# Patient Record
Sex: Male | Born: 1975 | Race: White | Hispanic: No | Marital: Married | Smoking: Current every day smoker
Health system: Southern US, Community
[De-identification: ages and names within clinical notes are randomized; demographics above are authoritative.]

## PROBLEM LIST (undated history)

## (undated) DIAGNOSIS — R7303 Prediabetes: Secondary | ICD-10-CM

## (undated) DIAGNOSIS — K219 Gastro-esophageal reflux disease without esophagitis: Secondary | ICD-10-CM

## (undated) DIAGNOSIS — J329 Chronic sinusitis, unspecified: Secondary | ICD-10-CM

## (undated) DIAGNOSIS — E785 Hyperlipidemia, unspecified: Secondary | ICD-10-CM

## (undated) DIAGNOSIS — Z6839 Body mass index (BMI) 39.0-39.9, adult: Secondary | ICD-10-CM

## (undated) DIAGNOSIS — I10 Essential (primary) hypertension: Secondary | ICD-10-CM

## (undated) DIAGNOSIS — K5790 Diverticulosis of intestine, part unspecified, without perforation or abscess without bleeding: Secondary | ICD-10-CM

## (undated) DIAGNOSIS — Z860100 Personal history of colon polyps, unspecified: Secondary | ICD-10-CM

## (undated) DIAGNOSIS — E669 Obesity, unspecified: Secondary | ICD-10-CM

## (undated) DIAGNOSIS — R0789 Other chest pain: Secondary | ICD-10-CM

## (undated) DIAGNOSIS — M5030 Other cervical disc degeneration, unspecified cervical region: Secondary | ICD-10-CM

## (undated) DIAGNOSIS — R109 Unspecified abdominal pain: Secondary | ICD-10-CM

## (undated) DIAGNOSIS — M519 Unspecified thoracic, thoracolumbar and lumbosacral intervertebral disc disorder: Secondary | ICD-10-CM

## (undated) DIAGNOSIS — R3129 Other microscopic hematuria: Secondary | ICD-10-CM

## (undated) HISTORY — DX: Diverticulosis of intestine, part unspecified, without perforation or abscess without bleeding: K57.90

## (undated) HISTORY — DX: Other microscopic hematuria: R31.29

## (undated) HISTORY — PX: WISDOM TOOTH EXTRACTION: SHX21

## (undated) HISTORY — DX: Essential (primary) hypertension: I10

## (undated) HISTORY — DX: Obesity, unspecified: E66.9

## (undated) HISTORY — DX: Body mass index (BMI) 39.0-39.9, adult: Z68.39

## (undated) HISTORY — DX: Chronic sinusitis, unspecified: J32.9

## (undated) HISTORY — DX: Other chest pain: R07.89

## (undated) HISTORY — DX: Gastro-esophageal reflux disease without esophagitis: K21.9

## (undated) HISTORY — DX: Hyperlipidemia, unspecified: E78.5

## (undated) HISTORY — DX: Unspecified abdominal pain: R10.9

## (undated) HISTORY — PX: COLONOSCOPY: SHX174

## (undated) HISTORY — DX: Unspecified thoracic, thoracolumbar and lumbosacral intervertebral disc disorder: M51.9

## (undated) HISTORY — DX: Prediabetes: R73.03

## (undated) HISTORY — DX: Personal history of colon polyps, unspecified: Z86.0100

## (undated) HISTORY — DX: Other cervical disc degeneration, unspecified cervical region: M50.30

---

## 1999-12-03 ENCOUNTER — Encounter: Payer: Self-pay | Admitting: Emergency Medicine

## 1999-12-03 ENCOUNTER — Emergency Department (HOSPITAL_COMMUNITY): Admission: EM | Admit: 1999-12-03 | Discharge: 1999-12-03 | Payer: Self-pay | Admitting: Emergency Medicine

## 1999-12-06 ENCOUNTER — Emergency Department (HOSPITAL_COMMUNITY): Admission: EM | Admit: 1999-12-06 | Discharge: 1999-12-06 | Payer: Self-pay | Admitting: Emergency Medicine

## 1999-12-13 ENCOUNTER — Emergency Department (HOSPITAL_COMMUNITY): Admission: EM | Admit: 1999-12-13 | Discharge: 1999-12-13 | Payer: Self-pay | Admitting: Emergency Medicine

## 1999-12-16 ENCOUNTER — Emergency Department (HOSPITAL_COMMUNITY): Admission: EM | Admit: 1999-12-16 | Discharge: 1999-12-16 | Payer: Self-pay | Admitting: Emergency Medicine

## 2001-09-13 ENCOUNTER — Emergency Department (HOSPITAL_COMMUNITY): Admission: EM | Admit: 2001-09-13 | Discharge: 2001-09-13 | Payer: Self-pay | Admitting: *Deleted

## 2001-09-13 ENCOUNTER — Encounter: Payer: Self-pay | Admitting: *Deleted

## 2004-06-10 ENCOUNTER — Emergency Department (HOSPITAL_COMMUNITY): Admission: EM | Admit: 2004-06-10 | Discharge: 2004-06-10 | Payer: Self-pay | Admitting: Emergency Medicine

## 2005-07-21 ENCOUNTER — Encounter: Admission: RE | Admit: 2005-07-21 | Discharge: 2005-07-21 | Payer: Self-pay | Admitting: Internal Medicine

## 2007-07-02 ENCOUNTER — Encounter: Admission: RE | Admit: 2007-07-02 | Discharge: 2007-07-02 | Payer: Self-pay | Admitting: Internal Medicine

## 2007-08-20 ENCOUNTER — Encounter: Admission: RE | Admit: 2007-08-20 | Discharge: 2007-08-20 | Payer: Self-pay | Admitting: Internal Medicine

## 2007-09-05 ENCOUNTER — Observation Stay (HOSPITAL_COMMUNITY): Admission: EM | Admit: 2007-09-05 | Discharge: 2007-09-06 | Payer: Self-pay | Admitting: Emergency Medicine

## 2007-09-10 ENCOUNTER — Encounter: Admission: RE | Admit: 2007-09-10 | Discharge: 2007-09-10 | Payer: Self-pay | Admitting: Internal Medicine

## 2010-08-24 NOTE — H&P (Signed)
NAMETAVEON, Russell Alvarez               ACCOUNT NO.:  [id number]   MEDICAL RECORD NO.:  [id number]          PATIENT TYPE:  EMS   LOCATION:  ED                           FACILITY[facility]  [facility]   PHYSICIAN:  Michiel Cowboy, MDDATE OF BIRTH:  Jul 09, 1975   DATE OF ADMISSION:  09/04/2007  DATE OF DISCHARGE:                              HISTORY & PHYSICAL   PRIMARY CARE Russell Alvarez:  Russell Housekeeper, MD   CHIEF COMPLAINT:  Abdominal pain.   This is a 35 year old male with past medical history of hypertension who  presents with a 3-day history of abdominal pain that started on Sunday,  at first was very severe.  He endorsed fevers and chills, temperature up  to 102 at home.  He did not want to come to emergency department because  he was out of town.  Eventually when he finally did present to emergency  department, his pain at this point is subsiding and he is feeling  better.  Has been afebrile, no nausea or vomiting.  No constipation or  diarrhea.   REVIEW OF SYSTEMS:  Unremarkable except for as per HPI.   PAST MEDICAL HISTORY:  Hypertension.   SOCIAL HISTORY:  The patient smokes but does not drink or use drugs.  Works in a plumbing business.   FAMILY HISTORY:  Noncontributory.   ALLERGIES:  No known drug allergies.   MEDICATIONS:  Diovan, dose unknown.   PHYSICAL EXAM:  VITAL SIGNS:  Temperature 98.7, blood pressure 120/88,  pulse 89, respirations 20, saturating 98% on room air.  The patient appears to be in no acute distress, fairly comfortable,  sitting up on the stretcher, not toxic.  LUNGS:  Clear to auscultation bilaterally.  HEART:  Regular rate and rhythm.  No murmurs, rubs or gallops.  HEAD:  Nontraumatic.  LOWER EXTREMITIES:  Without edema.  ABDOMEN:  Soft.  No rebound or guarding.  There is a localized left  lower quadrant pain and tenderness.  NEUROLOGIC:  Intact.   LABS:  White blood cell count 12.9, hemoglobin 14.5, platelets 184.  Sodium 139, potassium 3.5, creatinine  0.92.  UA shows blood but no white  blood cells, small red blood cells.  CT of the abdomen and pelvis  showing symmetrically concentric proximal to mid sigmoid colon wall  thickening with pericolonic soft tissue stranding with adjacent  extraluminal collection of gas and fluid measuring 2.7 x 1.9  cm in  maximum dimension.  Small number of proximal sigmoid colon diverticula.  Unremarkable urinary bladder and prostate gland.   ASSESSMENT AND PLAN:  This is a 35 year old gentleman with what appears  to be perforated diverticulitis.   1. Perforated diverticulitis.  Here in the emergency department this      case was discussed with surgery, who felt that there was no      operative intervention at this point.  Actually felt that the      patient could go home but given that he is still in some pain and      has an elevated white blood cell count, we will admit and put on IV  antibiotics, Zosyn, then the patient can be switched back to p.o.      Flagyl and Cipro.  We will give clear liquids, laxative of choice,      IV hydration.  Would recommend official surgical consult in a.m.      given the size of the abscess.  Would probably need to repeat CT      scan in about a week or so to demonstrate improvement.  2. Hypertension.  Unsure about the dose, but we will start on Diovan      80 mg until the patient's family can bring medications from home.  3. Prophylaxis.  Protonix and SCDs.   Dr. Donette Larry or his partners to assume care in the morning.      Michiel Cowboy, MD  Electronically Signed     AVD/MEDQ  D:  09/05/2007  T:  09/05/2007  Job:  [id number]   cc:   Russell Housekeeper, MD  Fax: [phone]

## 2010-08-24 NOTE — Discharge Summary (Signed)
Russell Alvarez, Russell Alvarez               ACCOUNT NO.:  [id number]   MEDICAL RECORD NO.:  [id number]          PATIENT TYPE:  INP   LOCATION:  1423                         FACILITY:  [facility]   PHYSICIAN:  [name], MD      DATE OF BIRTH:  [birth year]   DATE OF ADMISSION:  09/05/2007  DATE OF DISCHARGE:  09/06/2007                               DISCHARGE SUMMARY   DISCHARGE DIAGNOSES:  1. Acute diverticulitis with microperforation of the sigmoid, resolved      without surgery.  2. Hypokalemia, resolved.  3. Hypertension.   DISCHARGE MEDICATIONS:  1. Diovan HCT 80/12.5 mg daily.  2. Cipro 500 mg b.i.d. for 2 weeks.  3. Flagyl 500 mg b.i.d. for 2 weeks.  4. Percocet 5/325 one tablet q.6 p.r.n. for pain.  5. Colace stool softener b.i.d. p.r.n.   DIET:  Bland.   FOLLOW-UP:  On September 10, 2007, with follow-up CT scan of the abdomen and  pelvis.   CONSULTATION:  Surgery.   LABORATORY DATA:  White count 10.3, hemoglobin 13.7, potassium 3.7,  creatinine 0.9.  LFTs normal.  Blood cultures negative.  Urine culture  negative.  TSH 2.9.  A1c 5.6.  CT of the abdomen and pelvis; acute  sigmoid diverticulitis with microperforation contained.  The UA was  negative.   HOSPITAL COURSE:  A 35 year old male with a history of hypertension came  in with acute abdominal pain.  A CT scan of the abdomen and pelvis  showed acute diverticulitis with microperforation which was contained.  Surgery was consulted.  They considered IV antibiotics and IV fluids.  He did not require any surgical intervention.  The patient  hemodynamically remained stable.  His initial white count was elevated  at 12.9, came down to 10.3.  The patient was started on Zosyn IV and IV  fluids, which was later switched to Cipro and Flagyl p.o. for 2 weeks.  He remained afebrile.  The pain improved much significant.  The patient  will continue on a bland diet and will have a follow-up CT in about 3-5  days and follow as an outpatient.   Hypertension, will continue on Diovan  HCT.  Had did have mild hypokalemia, which resolved.      [name], MD  Electronically Signed     KH/MEDQ  D:  09/06/2007  T:  09/06/2007  Job:  [id number]

## 2011-01-05 LAB — URINE CULTURE
Colony Count: NO GROWTH
Culture: NO GROWTH

## 2011-01-05 LAB — BASIC METABOLIC PANEL
Chloride: 105
GFR calc Af Amer: >60
GFR calc non Af Amer: >60
GFR calc non Af Amer: >60
Potassium: 3.5
Potassium: 3.7
Sodium: 139

## 2011-01-05 LAB — COMPREHENSIVE METABOLIC PANEL
ALT: 20
Alkaline Phosphatase: 81
BUN: 11
CO2: 26
Chloride: 105
GFR calc non Af Amer: >60
Glucose, Bld: 101 — ABNORMAL HIGH
Potassium: 3.3 — ABNORMAL LOW
Total Bilirubin: 1.2
Total Protein: 6.9

## 2011-01-05 LAB — URINE MICROSCOPIC-ADD ON

## 2011-01-05 LAB — CBC
HCT: 38.9 — ABNORMAL LOW
HCT: 41.5
Hemoglobin: 13.7
Hemoglobin: 14.5
MCV: 88.6
Platelets: 175
RBC: 4.68
WBC: 10.3
WBC: 12.9 — ABNORMAL HIGH

## 2011-01-05 LAB — PROTIME-INR
INR: 1
Prothrombin Time: 13.7

## 2011-01-05 LAB — URINALYSIS, ROUTINE W REFLEX MICROSCOPIC
Glucose, UA: NEGATIVE
Ketones, ur: NEGATIVE
Leukocytes, UA: NEGATIVE
pH: 5.5

## 2011-01-05 LAB — DIFFERENTIAL
Eosinophils Relative: 1
Lymphocytes Relative: 17
Lymphs Abs: 2.2
Monocytes Absolute: 0.9

## 2011-01-05 LAB — LIPID PANEL
HDL: 27 — ABNORMAL LOW
LDL Cholesterol: 85
Total CHOL/HDL Ratio: 4.6
Triglycerides: 61
VLDL: 12

## 2011-01-05 LAB — CULTURE, BLOOD (ROUTINE X 2): Culture: NO GROWTH

## 2012-09-13 ENCOUNTER — Other Ambulatory Visit: Payer: Self-pay | Admitting: Internal Medicine

## 2012-09-13 ENCOUNTER — Ambulatory Visit
Admission: RE | Admit: 2012-09-13 | Discharge: 2012-09-13 | Disposition: A | Discharge status: Home / Self Care | Payer: BC Managed Care – PPO | Source: Ambulatory Visit | Attending: Internal Medicine | Admitting: Internal Medicine

## 2012-09-13 DIAGNOSIS — R109 Unspecified abdominal pain: Secondary | ICD-10-CM

## 2013-08-15 ENCOUNTER — Other Ambulatory Visit: Payer: Self-pay | Admitting: Internal Medicine

## 2013-08-15 DIAGNOSIS — R911 Solitary pulmonary nodule: Secondary | ICD-10-CM

## 2013-11-15 ENCOUNTER — Ambulatory Visit
Admission: RE | Admit: 2013-11-15 | Discharge: 2013-11-15 | Disposition: A | Discharge status: Home / Self Care | Payer: No Typology Code available for payment source | Source: Ambulatory Visit | Attending: Internal Medicine | Admitting: Internal Medicine

## 2013-11-15 ENCOUNTER — Encounter (INDEPENDENT_AMBULATORY_CARE_PROVIDER_SITE_OTHER): Payer: Self-pay

## 2013-11-15 DIAGNOSIS — R911 Solitary pulmonary nodule: Secondary | ICD-10-CM

## 2013-11-15 MED ORDER — IOHEXOL 300 MG/ML  SOLN
75.0000 mL | Freq: Once | INTRAMUSCULAR | Status: AC | PRN
  Administered 2013-11-15: 75 mL via INTRAVENOUS

## 2014-01-07 ENCOUNTER — Encounter: Payer: Self-pay | Admitting: *Deleted

## 2014-07-08 ENCOUNTER — Ambulatory Visit (INDEPENDENT_AMBULATORY_CARE_PROVIDER_SITE_OTHER): Payer: No Typology Code available for payment source | Admitting: Interventional Cardiology

## 2014-07-08 ENCOUNTER — Encounter: Payer: Self-pay | Admitting: Interventional Cardiology

## 2014-07-08 VITALS — BP 122/80 | HR 62 | Ht 73.2 in | Wt 291.0 lb

## 2014-07-08 DIAGNOSIS — I10 Essential (primary) hypertension: Secondary | ICD-10-CM | POA: Diagnosis not present

## 2014-07-08 DIAGNOSIS — I493 Ventricular premature depolarization: Secondary | ICD-10-CM | POA: Diagnosis not present

## 2014-07-08 DIAGNOSIS — E663 Overweight: Secondary | ICD-10-CM

## 2014-07-08 NOTE — Progress Notes (Signed)
Patient ID: Russell Alvarez, male   DOB: December 12, 1975, 39 y.o.   MRN: [id number]     Cardiology Office Note   Date:  07/08/2014   ID:  Russell Alvarez, DOB [birth year], MRN [id number]  PCP:  [name], MD  Cardiologist:   Corky Crafts., MD   No chief complaint on file. PVCs    History of Present Illness: Russell Alvarez is a 39 y.o. male who has had PVCs and atypical chest pain in the past.  I saw him in 2012.  He has had HTN as well.  He had a rash, eczema, which was treated with clobetasol.  Once this was started, he had frequent PVC s and he felt tired.  He stopped the cream and the PVCs decreased significantly.    He does minimize his caffeine intake.  He avoids coffee and soda but drinks tea.  He walks a lot.  He quit all tobacco use.  He has had trouble losing weight.  He follows with Dr. Donette Larry every 6 months.     Past Medical History  Diagnosis Date  . Hypertension   . Dyslipidemia   . GERD (gastroesophageal reflux disease)   . Diverticulosis   . Obesity   . DDD (degenerative disc disease), cervical     Past Surgical History  Procedure Laterality Date  . Colonoscopy    . Wisdom tooth extraction       Current Outpatient Prescriptions  Medication Sig Dispense Refill  . clobetasol cream (TEMOVATE) 0.05 % Apply 1 application topically 2 (two) times daily.    . fluticasone (FLONASE) 50 MCG/ACT nasal spray   1  . Omega-3 Fatty Acids (FISH OIL) 1000 MG CAPS Take by mouth.    . pantoprazole (PROTONIX) 40 MG tablet Take 40 mg by mouth daily.    . valsartan-hydrochlorothiazide (DIOVAN-HCT) 80-12.5 MG per tablet Take 1 tablet by mouth daily.     No current facility-administered medications for this visit.    Allergies:   Review of patient's allergies indicates no known allergies.    Social History:  The patient  reports that he has been smoking.  He does not have any smokeless tobacco history on file. He reports that he drinks alcohol. He reports that he does  not use illicit drugs.   Family History:  The patient's family history includes Heart failure in his mother; Hypertension in his mother.    ROS:  Please see the history of present illness.   Otherwise, review of systems are positive for recent palpitations as noted above. These felt similar to the PVCs that he had in the past. No chest discomfort with exertion.   All other systems are reviewed and negative.    PHYSICAL EXAM: VS:  Pulse 62  Ht 6' 1.2" (1.859 m)  Wt 291 lb (131.997 kg)  BMI 38.19 kg/m2 , BMI Body mass index is 38.19 kg/(m^2). GEN: Well nourished, well developed, in no acute distress HEENT: normal Neck: no JVD, carotid bruits, or masses Cardiac: RRR; no murmurs, rubs, or gallops,no edema  Respiratory:  clear to auscultation bilaterally, normal work of breathing GI: soft, nontender, nondistended, + BS MS: no deformity or atrophy Skin: warm and dry, areas of excoriation noted on his elbows and his calves  Neuro:  Strength and sensation are intact Psych: euthymic mood, full affect   EKG:  EKG is ordered today. The ekg ordered today demnormal sinus rhythm, no significant ST segment changes, sinus arrhythmia   Recent Labs: No results found  for requested labs within last 365 days.    Lipid Panel    Component Value Date/Time   CHOL  09/05/2007 0520    124        ATP III CLASSIFICATION:  <200     mg/dL   Desirable  [id number]  mg/dL   Borderline High  >=469>=240    mg/dL   High   TRIG 61 62/95/284105/27/2009 0520   HDL 27* 09/05/2007 0520   CHOLHDL 4.6 09/05/2007 0520   VLDL 12 09/05/2007 0520   LDLCALC  09/05/2007 0520    85        Total Cholesterol/HDL:CHD Risk Coronary Heart Disease Risk Table                     Men   Women  1/2 Average Risk   3.4   3.3      Wt Readings from Last 3 Encounters:  07/08/14 291 lb (131.997 kg)      Other studies Reviewed: Additional studies/ records that were reviewed today include: 2007 echo. Review of the above records  demonstrates: normal echo 3/12 stress test: no ischemia  ASSESSMENT AND PLAN:  1.   PVCs: Better control since stopping the steroid cream. Continue to avoid caffeine. I asked him to decrease his tea intake both for minimizing caffeine as well as for weight loss.  Negative workup several years ago. Given that his symptoms are better now, we'll not plan on any further testing.  2.  Obesity: Continue regular exercise. Decrease sweet tea intake. There significant calories that he is likely taking him with a couple of glasses of sweet tea daily. This will help with weight loss. Continue efforts at healthy diet.  3.  Hypertension: Well controlled. Continue current medicines. Continue regular labs with Dr. Eula ListenHussain.   Current medicines are reviewed at length with the patient today.  The patient does not have concerns regarding medicines.  The following changes have been made:  no change  Labs/ tests ordered today include: none  No orders of the defined types were placed in this encounter.     Disposition:   FU with me prn   Delorise JacksonSigned, Doloris Servantes S., MD  07/08/2014 9:02 AM    [facility] [address], [place], [place]  [address] Phone: [phone]; Fax: [phone]

## 2014-07-08 NOTE — Patient Instructions (Signed)
**Note De-identified Russell Alvarez Obfuscation** Your physician recommends that you continue on your current medications as directed. Please refer to the Current Medication list given to you today.  Your physician recommends that you schedule a follow-up appointment in: as needed  

## 2016-05-05 DIAGNOSIS — J019 Acute sinusitis, unspecified: Secondary | ICD-10-CM | POA: Diagnosis not present

## 2016-05-10 DIAGNOSIS — J019 Acute sinusitis, unspecified: Secondary | ICD-10-CM | POA: Diagnosis not present

## 2016-06-22 DIAGNOSIS — J019 Acute sinusitis, unspecified: Secondary | ICD-10-CM | POA: Diagnosis not present

## 2016-06-22 DIAGNOSIS — J309 Allergic rhinitis, unspecified: Secondary | ICD-10-CM | POA: Diagnosis not present

## 2016-07-14 DIAGNOSIS — H6502 Acute serous otitis media, left ear: Secondary | ICD-10-CM | POA: Diagnosis not present

## 2016-07-28 DIAGNOSIS — J3089 Other allergic rhinitis: Secondary | ICD-10-CM | POA: Diagnosis not present

## 2016-07-28 DIAGNOSIS — H6983 Other specified disorders of Eustachian tube, bilateral: Secondary | ICD-10-CM | POA: Diagnosis not present

## 2016-08-19 DIAGNOSIS — H6982 Other specified disorders of Eustachian tube, left ear: Secondary | ICD-10-CM | POA: Diagnosis not present

## 2016-09-03 DIAGNOSIS — K5792 Diverticulitis of intestine, part unspecified, without perforation or abscess without bleeding: Secondary | ICD-10-CM | POA: Diagnosis not present

## 2016-09-20 DIAGNOSIS — H6983 Other specified disorders of Eustachian tube, bilateral: Secondary | ICD-10-CM | POA: Diagnosis not present

## 2016-10-04 DIAGNOSIS — Z1389 Encounter for screening for other disorder: Secondary | ICD-10-CM | POA: Diagnosis not present

## 2016-10-04 DIAGNOSIS — E782 Mixed hyperlipidemia: Secondary | ICD-10-CM | POA: Diagnosis not present

## 2016-10-04 DIAGNOSIS — Z0000 Encounter for general adult medical examination without abnormal findings: Secondary | ICD-10-CM | POA: Diagnosis not present

## 2016-10-04 DIAGNOSIS — I10 Essential (primary) hypertension: Secondary | ICD-10-CM | POA: Diagnosis not present

## 2016-11-11 DIAGNOSIS — R0789 Other chest pain: Secondary | ICD-10-CM | POA: Diagnosis not present

## 2016-11-11 DIAGNOSIS — K219 Gastro-esophageal reflux disease without esophagitis: Secondary | ICD-10-CM | POA: Diagnosis not present

## 2016-12-06 DIAGNOSIS — H9203 Otalgia, bilateral: Secondary | ICD-10-CM | POA: Diagnosis not present

## 2016-12-06 DIAGNOSIS — I10 Essential (primary) hypertension: Secondary | ICD-10-CM | POA: Diagnosis not present

## 2016-12-19 DIAGNOSIS — R51 Headache: Secondary | ICD-10-CM | POA: Diagnosis not present

## 2016-12-19 DIAGNOSIS — J320 Chronic maxillary sinusitis: Secondary | ICD-10-CM | POA: Diagnosis not present

## 2016-12-19 DIAGNOSIS — J322 Chronic ethmoidal sinusitis: Secondary | ICD-10-CM | POA: Diagnosis not present

## 2016-12-19 DIAGNOSIS — J341 Cyst and mucocele of nose and nasal sinus: Secondary | ICD-10-CM | POA: Diagnosis not present

## 2016-12-19 DIAGNOSIS — J329 Chronic sinusitis, unspecified: Secondary | ICD-10-CM | POA: Diagnosis not present

## 2016-12-27 ENCOUNTER — Ambulatory Visit
Admission: RE | Admit: 2016-12-27 | Discharge: 2016-12-27 | Disposition: A | Discharge status: Home / Self Care | Payer: BLUE CROSS/BLUE SHIELD | Source: Ambulatory Visit | Attending: Internal Medicine | Admitting: Internal Medicine

## 2016-12-27 ENCOUNTER — Other Ambulatory Visit: Payer: Self-pay | Admitting: Internal Medicine

## 2016-12-27 DIAGNOSIS — R519 Headache, unspecified: Secondary | ICD-10-CM

## 2016-12-27 DIAGNOSIS — R51 Headache: Principal | ICD-10-CM

## 2017-03-21 DIAGNOSIS — J309 Allergic rhinitis, unspecified: Secondary | ICD-10-CM | POA: Diagnosis not present

## 2017-03-21 DIAGNOSIS — M7660 Achilles tendinitis, unspecified leg: Secondary | ICD-10-CM | POA: Diagnosis not present

## 2017-03-21 DIAGNOSIS — I10 Essential (primary) hypertension: Secondary | ICD-10-CM | POA: Diagnosis not present

## 2017-04-13 DIAGNOSIS — J0140 Acute pansinusitis, unspecified: Secondary | ICD-10-CM | POA: Diagnosis not present

## 2017-04-27 DIAGNOSIS — M79672 Pain in left foot: Secondary | ICD-10-CM | POA: Diagnosis not present

## 2017-05-04 DIAGNOSIS — J069 Acute upper respiratory infection, unspecified: Secondary | ICD-10-CM | POA: Diagnosis not present

## 2017-05-31 DIAGNOSIS — R0781 Pleurodynia: Secondary | ICD-10-CM | POA: Diagnosis not present

## 2017-05-31 DIAGNOSIS — R1010 Upper abdominal pain, unspecified: Secondary | ICD-10-CM | POA: Diagnosis not present

## 2017-05-31 DIAGNOSIS — J329 Chronic sinusitis, unspecified: Secondary | ICD-10-CM | POA: Diagnosis not present

## 2017-07-04 DIAGNOSIS — R05 Cough: Secondary | ICD-10-CM | POA: Diagnosis not present

## 2017-10-05 DIAGNOSIS — E782 Mixed hyperlipidemia: Secondary | ICD-10-CM | POA: Diagnosis not present

## 2017-10-05 DIAGNOSIS — Z1389 Encounter for screening for other disorder: Secondary | ICD-10-CM | POA: Diagnosis not present

## 2017-10-05 DIAGNOSIS — Z0000 Encounter for general adult medical examination without abnormal findings: Secondary | ICD-10-CM | POA: Diagnosis not present

## 2017-11-15 DIAGNOSIS — J329 Chronic sinusitis, unspecified: Secondary | ICD-10-CM | POA: Diagnosis not present

## 2017-11-15 DIAGNOSIS — J3081 Allergic rhinitis due to animal (cat) (dog) hair and dander: Secondary | ICD-10-CM | POA: Diagnosis not present

## 2017-11-15 DIAGNOSIS — J3089 Other allergic rhinitis: Secondary | ICD-10-CM | POA: Diagnosis not present

## 2017-11-15 DIAGNOSIS — R21 Rash and other nonspecific skin eruption: Secondary | ICD-10-CM | POA: Diagnosis not present

## 2017-11-15 DIAGNOSIS — J301 Allergic rhinitis due to pollen: Secondary | ICD-10-CM | POA: Diagnosis not present

## 2018-03-12 DIAGNOSIS — Z86010 Personal history of colonic polyps: Secondary | ICD-10-CM | POA: Diagnosis not present

## 2018-03-12 DIAGNOSIS — K5730 Diverticulosis of large intestine without perforation or abscess without bleeding: Secondary | ICD-10-CM | POA: Diagnosis not present

## 2018-03-12 DIAGNOSIS — D123 Benign neoplasm of transverse colon: Secondary | ICD-10-CM | POA: Diagnosis not present

## 2018-03-14 DIAGNOSIS — D123 Benign neoplasm of transverse colon: Secondary | ICD-10-CM | POA: Diagnosis not present

## 2018-04-27 DIAGNOSIS — J3081 Allergic rhinitis due to animal (cat) (dog) hair and dander: Secondary | ICD-10-CM | POA: Diagnosis not present

## 2018-04-27 DIAGNOSIS — J329 Chronic sinusitis, unspecified: Secondary | ICD-10-CM | POA: Diagnosis not present

## 2018-04-27 DIAGNOSIS — J301 Allergic rhinitis due to pollen: Secondary | ICD-10-CM | POA: Diagnosis not present

## 2018-04-27 DIAGNOSIS — J3089 Other allergic rhinitis: Secondary | ICD-10-CM | POA: Diagnosis not present

## 2018-08-31 DIAGNOSIS — J309 Allergic rhinitis, unspecified: Secondary | ICD-10-CM | POA: Diagnosis not present

## 2018-08-31 DIAGNOSIS — I10 Essential (primary) hypertension: Secondary | ICD-10-CM | POA: Diagnosis not present

## 2018-08-31 DIAGNOSIS — R21 Rash and other nonspecific skin eruption: Secondary | ICD-10-CM | POA: Diagnosis not present

## 2018-09-18 DIAGNOSIS — Z0000 Encounter for general adult medical examination without abnormal findings: Secondary | ICD-10-CM | POA: Diagnosis not present

## 2018-09-18 DIAGNOSIS — I10 Essential (primary) hypertension: Secondary | ICD-10-CM | POA: Diagnosis not present

## 2018-09-18 DIAGNOSIS — Z125 Encounter for screening for malignant neoplasm of prostate: Secondary | ICD-10-CM | POA: Diagnosis not present

## 2018-09-18 DIAGNOSIS — Z1389 Encounter for screening for other disorder: Secondary | ICD-10-CM | POA: Diagnosis not present

## 2018-09-18 DIAGNOSIS — Z23 Encounter for immunization: Secondary | ICD-10-CM | POA: Diagnosis not present

## 2018-12-03 DIAGNOSIS — J3081 Allergic rhinitis due to animal (cat) (dog) hair and dander: Secondary | ICD-10-CM | POA: Diagnosis not present

## 2018-12-03 DIAGNOSIS — J329 Chronic sinusitis, unspecified: Secondary | ICD-10-CM | POA: Diagnosis not present

## 2018-12-03 DIAGNOSIS — J301 Allergic rhinitis due to pollen: Secondary | ICD-10-CM | POA: Diagnosis not present

## 2018-12-03 DIAGNOSIS — J3089 Other allergic rhinitis: Secondary | ICD-10-CM | POA: Diagnosis not present

## 2019-02-05 DIAGNOSIS — M542 Cervicalgia: Secondary | ICD-10-CM | POA: Diagnosis not present

## 2019-02-05 DIAGNOSIS — M519 Unspecified thoracic, thoracolumbar and lumbosacral intervertebral disc disorder: Secondary | ICD-10-CM | POA: Diagnosis not present

## 2019-09-25 DIAGNOSIS — E782 Mixed hyperlipidemia: Secondary | ICD-10-CM | POA: Diagnosis not present

## 2019-09-25 DIAGNOSIS — Z1389 Encounter for screening for other disorder: Secondary | ICD-10-CM | POA: Diagnosis not present

## 2019-09-25 DIAGNOSIS — Z0000 Encounter for general adult medical examination without abnormal findings: Secondary | ICD-10-CM | POA: Diagnosis not present

## 2019-09-25 DIAGNOSIS — K219 Gastro-esophageal reflux disease without esophagitis: Secondary | ICD-10-CM | POA: Diagnosis not present

## 2019-09-25 DIAGNOSIS — Z125 Encounter for screening for malignant neoplasm of prostate: Secondary | ICD-10-CM | POA: Diagnosis not present

## 2019-09-25 DIAGNOSIS — I10 Essential (primary) hypertension: Secondary | ICD-10-CM | POA: Diagnosis not present

## 2019-12-17 DIAGNOSIS — R234 Changes in skin texture: Secondary | ICD-10-CM | POA: Diagnosis not present

## 2019-12-17 DIAGNOSIS — R350 Frequency of micturition: Secondary | ICD-10-CM | POA: Diagnosis not present

## 2020-03-31 DIAGNOSIS — M79672 Pain in left foot: Secondary | ICD-10-CM | POA: Diagnosis not present

## 2020-04-06 DIAGNOSIS — J019 Acute sinusitis, unspecified: Secondary | ICD-10-CM | POA: Diagnosis not present

## 2020-04-06 DIAGNOSIS — U071 COVID-19: Secondary | ICD-10-CM | POA: Diagnosis not present

## 2020-04-06 DIAGNOSIS — J209 Acute bronchitis, unspecified: Secondary | ICD-10-CM | POA: Diagnosis not present

## 2020-04-11 DIAGNOSIS — R059 Cough, unspecified: Secondary | ICD-10-CM | POA: Diagnosis not present

## 2020-04-11 DIAGNOSIS — J1282 Pneumonia due to coronavirus disease 2019: Secondary | ICD-10-CM | POA: Diagnosis not present

## 2020-04-11 DIAGNOSIS — U071 COVID-19: Secondary | ICD-10-CM | POA: Diagnosis not present

## 2020-04-11 DIAGNOSIS — R0602 Shortness of breath: Secondary | ICD-10-CM | POA: Diagnosis not present

## 2020-04-11 DIAGNOSIS — R079 Chest pain, unspecified: Secondary | ICD-10-CM | POA: Diagnosis not present

## 2020-04-23 ENCOUNTER — Other Ambulatory Visit: Payer: Self-pay | Admitting: Internal Medicine

## 2020-04-23 ENCOUNTER — Ambulatory Visit
Admission: RE | Admit: 2020-04-23 | Discharge: 2020-04-23 | Disposition: A | Discharge status: Home / Self Care | Payer: BC Managed Care – PPO | Source: Ambulatory Visit | Attending: Internal Medicine | Admitting: Internal Medicine

## 2020-04-23 DIAGNOSIS — J189 Pneumonia, unspecified organism: Secondary | ICD-10-CM

## 2020-04-23 DIAGNOSIS — U071 COVID-19: Secondary | ICD-10-CM | POA: Diagnosis not present

## 2020-04-23 DIAGNOSIS — M79672 Pain in left foot: Secondary | ICD-10-CM | POA: Diagnosis not present

## 2020-04-23 DIAGNOSIS — Z8701 Personal history of pneumonia (recurrent): Secondary | ICD-10-CM | POA: Diagnosis not present

## 2020-06-23 DIAGNOSIS — N390 Urinary tract infection, site not specified: Secondary | ICD-10-CM | POA: Diagnosis not present

## 2020-06-23 DIAGNOSIS — J329 Chronic sinusitis, unspecified: Secondary | ICD-10-CM | POA: Diagnosis not present

## 2020-06-23 DIAGNOSIS — J301 Allergic rhinitis due to pollen: Secondary | ICD-10-CM | POA: Diagnosis not present

## 2020-06-23 DIAGNOSIS — J3081 Allergic rhinitis due to animal (cat) (dog) hair and dander: Secondary | ICD-10-CM | POA: Diagnosis not present

## 2020-06-23 DIAGNOSIS — J3089 Other allergic rhinitis: Secondary | ICD-10-CM | POA: Diagnosis not present

## 2020-07-07 DIAGNOSIS — J3089 Other allergic rhinitis: Secondary | ICD-10-CM | POA: Diagnosis not present

## 2020-07-07 DIAGNOSIS — J019 Acute sinusitis, unspecified: Secondary | ICD-10-CM | POA: Diagnosis not present

## 2020-07-07 DIAGNOSIS — J301 Allergic rhinitis due to pollen: Secondary | ICD-10-CM | POA: Diagnosis not present

## 2020-07-07 DIAGNOSIS — J329 Chronic sinusitis, unspecified: Secondary | ICD-10-CM | POA: Diagnosis not present

## 2020-07-07 DIAGNOSIS — J3081 Allergic rhinitis due to animal (cat) (dog) hair and dander: Secondary | ICD-10-CM | POA: Diagnosis not present

## 2020-10-18 DIAGNOSIS — M25562 Pain in left knee: Secondary | ICD-10-CM | POA: Diagnosis not present

## 2020-10-18 DIAGNOSIS — Z6839 Body mass index (BMI) 39.0-39.9, adult: Secondary | ICD-10-CM | POA: Diagnosis not present

## 2020-12-11 DIAGNOSIS — R051 Acute cough: Secondary | ICD-10-CM | POA: Diagnosis not present

## 2021-03-31 DIAGNOSIS — H65192 Other acute nonsuppurative otitis media, left ear: Secondary | ICD-10-CM | POA: Diagnosis not present

## 2021-04-09 DIAGNOSIS — I10 Essential (primary) hypertension: Secondary | ICD-10-CM | POA: Diagnosis not present

## 2021-04-09 DIAGNOSIS — R7303 Prediabetes: Secondary | ICD-10-CM | POA: Diagnosis not present

## 2021-04-09 DIAGNOSIS — M791 Myalgia, unspecified site: Secondary | ICD-10-CM | POA: Diagnosis not present

## 2021-04-09 DIAGNOSIS — M519 Unspecified thoracic, thoracolumbar and lumbosacral intervertebral disc disorder: Secondary | ICD-10-CM | POA: Diagnosis not present

## 2021-04-09 DIAGNOSIS — M5442 Lumbago with sciatica, left side: Secondary | ICD-10-CM | POA: Diagnosis not present

## 2021-04-09 DIAGNOSIS — M5441 Lumbago with sciatica, right side: Secondary | ICD-10-CM | POA: Diagnosis not present

## 2021-05-17 DIAGNOSIS — H5213 Myopia, bilateral: Secondary | ICD-10-CM | POA: Diagnosis not present

## 2021-05-17 DIAGNOSIS — H04123 Dry eye syndrome of bilateral lacrimal glands: Secondary | ICD-10-CM | POA: Diagnosis not present

## 2021-05-17 DIAGNOSIS — D3131 Benign neoplasm of right choroid: Secondary | ICD-10-CM | POA: Diagnosis not present

## 2021-08-20 ENCOUNTER — Other Ambulatory Visit: Payer: Self-pay | Admitting: Internal Medicine

## 2021-08-20 ENCOUNTER — Ambulatory Visit
Admission: RE | Admit: 2021-08-20 | Discharge: 2021-08-20 | Disposition: A | Discharge status: Home / Self Care | Payer: BC Managed Care – PPO | Source: Ambulatory Visit | Attending: Internal Medicine | Admitting: Internal Medicine

## 2021-08-20 DIAGNOSIS — R1010 Upper abdominal pain, unspecified: Secondary | ICD-10-CM

## 2021-08-20 DIAGNOSIS — R109 Unspecified abdominal pain: Secondary | ICD-10-CM | POA: Diagnosis not present

## 2021-08-30 DIAGNOSIS — J329 Chronic sinusitis, unspecified: Secondary | ICD-10-CM | POA: Diagnosis not present

## 2021-08-30 DIAGNOSIS — J301 Allergic rhinitis due to pollen: Secondary | ICD-10-CM | POA: Diagnosis not present

## 2021-08-30 DIAGNOSIS — J3081 Allergic rhinitis due to animal (cat) (dog) hair and dander: Secondary | ICD-10-CM | POA: Diagnosis not present

## 2021-08-30 DIAGNOSIS — J3089 Other allergic rhinitis: Secondary | ICD-10-CM | POA: Diagnosis not present

## 2021-09-23 DIAGNOSIS — H6691 Otitis media, unspecified, right ear: Secondary | ICD-10-CM | POA: Diagnosis not present

## 2021-11-09 DIAGNOSIS — L308 Other specified dermatitis: Secondary | ICD-10-CM | POA: Diagnosis not present

## 2021-11-09 DIAGNOSIS — B353 Tinea pedis: Secondary | ICD-10-CM | POA: Diagnosis not present

## 2021-11-17 DIAGNOSIS — H6990 Unspecified Eustachian tube disorder, unspecified ear: Secondary | ICD-10-CM | POA: Diagnosis not present

## 2022-01-26 DIAGNOSIS — J019 Acute sinusitis, unspecified: Secondary | ICD-10-CM | POA: Diagnosis not present

## 2022-04-08 DIAGNOSIS — J40 Bronchitis, not specified as acute or chronic: Secondary | ICD-10-CM | POA: Diagnosis not present

## 2022-04-15 DIAGNOSIS — H6692 Otitis media, unspecified, left ear: Secondary | ICD-10-CM | POA: Diagnosis not present

## 2022-05-29 DIAGNOSIS — J019 Acute sinusitis, unspecified: Secondary | ICD-10-CM | POA: Diagnosis not present

## 2022-07-01 DIAGNOSIS — R221 Localized swelling, mass and lump, neck: Secondary | ICD-10-CM | POA: Diagnosis not present

## 2022-08-29 DIAGNOSIS — J301 Allergic rhinitis due to pollen: Secondary | ICD-10-CM | POA: Diagnosis not present

## 2022-08-29 DIAGNOSIS — J329 Chronic sinusitis, unspecified: Secondary | ICD-10-CM | POA: Diagnosis not present

## 2022-08-29 DIAGNOSIS — J3081 Allergic rhinitis due to animal (cat) (dog) hair and dander: Secondary | ICD-10-CM | POA: Diagnosis not present

## 2022-08-29 DIAGNOSIS — J3089 Other allergic rhinitis: Secondary | ICD-10-CM | POA: Diagnosis not present

## 2022-10-26 DIAGNOSIS — J069 Acute upper respiratory infection, unspecified: Secondary | ICD-10-CM | POA: Diagnosis not present

## 2022-11-03 DIAGNOSIS — J209 Acute bronchitis, unspecified: Secondary | ICD-10-CM | POA: Diagnosis not present

## 2022-11-17 DIAGNOSIS — R002 Palpitations: Secondary | ICD-10-CM | POA: Diagnosis not present

## 2022-11-17 DIAGNOSIS — R051 Acute cough: Secondary | ICD-10-CM | POA: Diagnosis not present

## 2022-11-18 ENCOUNTER — Ambulatory Visit: Admission: RE | Admit: 2022-11-18 | Payer: BC Managed Care – PPO | Source: Ambulatory Visit

## 2022-11-18 ENCOUNTER — Other Ambulatory Visit: Payer: Self-pay | Admitting: Internal Medicine

## 2022-11-18 DIAGNOSIS — R051 Acute cough: Secondary | ICD-10-CM

## 2022-11-18 DIAGNOSIS — R058 Other specified cough: Secondary | ICD-10-CM | POA: Diagnosis not present

## 2022-12-08 DIAGNOSIS — R059 Cough, unspecified: Secondary | ICD-10-CM | POA: Diagnosis not present

## 2022-12-08 DIAGNOSIS — J45909 Unspecified asthma, uncomplicated: Secondary | ICD-10-CM | POA: Diagnosis not present

## 2022-12-13 ENCOUNTER — Other Ambulatory Visit: Payer: Self-pay | Admitting: Internal Medicine

## 2022-12-13 DIAGNOSIS — R059 Cough, unspecified: Secondary | ICD-10-CM

## 2022-12-15 ENCOUNTER — Other Ambulatory Visit: Payer: BC Managed Care – PPO

## 2022-12-15 ENCOUNTER — Other Ambulatory Visit (HOSPITAL_COMMUNITY): Payer: Self-pay | Admitting: Internal Medicine

## 2022-12-15 DIAGNOSIS — R059 Cough, unspecified: Secondary | ICD-10-CM

## 2022-12-17 ENCOUNTER — Ambulatory Visit (HOSPITAL_BASED_OUTPATIENT_CLINIC_OR_DEPARTMENT_OTHER)
Admission: RE | Admit: 2022-12-17 | Discharge: 2022-12-17 | Disposition: A | Discharge status: Home / Self Care | Payer: BC Managed Care – PPO | Source: Ambulatory Visit | Attending: Internal Medicine | Admitting: Internal Medicine

## 2022-12-17 DIAGNOSIS — R059 Cough, unspecified: Secondary | ICD-10-CM | POA: Insufficient documentation

## 2022-12-17 DIAGNOSIS — R918 Other nonspecific abnormal finding of lung field: Secondary | ICD-10-CM | POA: Diagnosis not present

## 2023-02-27 DIAGNOSIS — Z6841 Body Mass Index (BMI) 40.0 and over, adult: Secondary | ICD-10-CM | POA: Diagnosis not present

## 2023-02-27 DIAGNOSIS — J0101 Acute recurrent maxillary sinusitis: Secondary | ICD-10-CM | POA: Diagnosis not present

## 2023-03-15 DIAGNOSIS — I10 Essential (primary) hypertension: Secondary | ICD-10-CM | POA: Diagnosis not present

## 2023-03-15 DIAGNOSIS — E782 Mixed hyperlipidemia: Secondary | ICD-10-CM | POA: Diagnosis not present

## 2023-03-15 DIAGNOSIS — R7303 Prediabetes: Secondary | ICD-10-CM | POA: Diagnosis not present

## 2023-03-15 DIAGNOSIS — Z13220 Encounter for screening for lipoid disorders: Secondary | ICD-10-CM | POA: Diagnosis not present

## 2023-03-15 DIAGNOSIS — Z0000 Encounter for general adult medical examination without abnormal findings: Secondary | ICD-10-CM | POA: Diagnosis not present

## 2023-03-15 DIAGNOSIS — Z125 Encounter for screening for malignant neoplasm of prostate: Secondary | ICD-10-CM | POA: Diagnosis not present

## 2023-04-13 DIAGNOSIS — Z01818 Encounter for other preprocedural examination: Secondary | ICD-10-CM | POA: Diagnosis not present

## 2023-04-24 DIAGNOSIS — R7303 Prediabetes: Secondary | ICD-10-CM | POA: Diagnosis not present

## 2023-04-24 DIAGNOSIS — R5382 Chronic fatigue, unspecified: Secondary | ICD-10-CM | POA: Diagnosis not present

## 2023-04-24 DIAGNOSIS — E559 Vitamin D deficiency, unspecified: Secondary | ICD-10-CM | POA: Diagnosis not present

## 2023-04-24 DIAGNOSIS — I10 Essential (primary) hypertension: Secondary | ICD-10-CM | POA: Diagnosis not present

## 2023-04-24 DIAGNOSIS — R0683 Snoring: Secondary | ICD-10-CM | POA: Diagnosis not present

## 2023-04-24 DIAGNOSIS — Z1331 Encounter for screening for depression: Secondary | ICD-10-CM | POA: Diagnosis not present

## 2023-04-24 DIAGNOSIS — E782 Mixed hyperlipidemia: Secondary | ICD-10-CM | POA: Diagnosis not present

## 2023-04-24 DIAGNOSIS — R6889 Other general symptoms and signs: Secondary | ICD-10-CM | POA: Diagnosis not present

## 2023-05-09 DIAGNOSIS — G4730 Sleep apnea, unspecified: Secondary | ICD-10-CM | POA: Diagnosis not present

## 2023-05-09 DIAGNOSIS — R7303 Prediabetes: Secondary | ICD-10-CM | POA: Diagnosis not present

## 2023-05-09 DIAGNOSIS — E782 Mixed hyperlipidemia: Secondary | ICD-10-CM | POA: Diagnosis not present

## 2023-05-09 DIAGNOSIS — I10 Essential (primary) hypertension: Secondary | ICD-10-CM | POA: Diagnosis not present

## 2023-05-17 DIAGNOSIS — R509 Fever, unspecified: Secondary | ICD-10-CM | POA: Diagnosis not present

## 2023-05-17 DIAGNOSIS — B349 Viral infection, unspecified: Secondary | ICD-10-CM | POA: Diagnosis not present

## 2023-05-17 DIAGNOSIS — Z20828 Contact with and (suspected) exposure to other viral communicable diseases: Secondary | ICD-10-CM | POA: Diagnosis not present

## 2023-05-17 DIAGNOSIS — R52 Pain, unspecified: Secondary | ICD-10-CM | POA: Diagnosis not present

## 2023-05-23 DIAGNOSIS — I10 Essential (primary) hypertension: Secondary | ICD-10-CM | POA: Diagnosis not present

## 2023-05-23 DIAGNOSIS — E782 Mixed hyperlipidemia: Secondary | ICD-10-CM | POA: Diagnosis not present

## 2023-05-23 DIAGNOSIS — R7303 Prediabetes: Secondary | ICD-10-CM | POA: Diagnosis not present

## 2023-05-23 DIAGNOSIS — G4730 Sleep apnea, unspecified: Secondary | ICD-10-CM | POA: Diagnosis not present

## 2023-06-27 DIAGNOSIS — Z9189 Other specified personal risk factors, not elsewhere classified: Secondary | ICD-10-CM | POA: Diagnosis not present

## 2023-06-27 DIAGNOSIS — G4730 Sleep apnea, unspecified: Secondary | ICD-10-CM | POA: Diagnosis not present

## 2023-06-27 DIAGNOSIS — R7303 Prediabetes: Secondary | ICD-10-CM | POA: Diagnosis not present

## 2023-06-27 DIAGNOSIS — I10 Essential (primary) hypertension: Secondary | ICD-10-CM | POA: Diagnosis not present

## 2023-06-27 DIAGNOSIS — E782 Mixed hyperlipidemia: Secondary | ICD-10-CM | POA: Diagnosis not present

## 2023-06-30 DIAGNOSIS — K635 Polyp of colon: Secondary | ICD-10-CM | POA: Diagnosis not present

## 2023-06-30 DIAGNOSIS — K5730 Diverticulosis of large intestine without perforation or abscess without bleeding: Secondary | ICD-10-CM | POA: Diagnosis not present

## 2023-06-30 DIAGNOSIS — Z860101 Personal history of adenomatous and serrated colon polyps: Secondary | ICD-10-CM | POA: Diagnosis not present

## 2023-06-30 DIAGNOSIS — Z09 Encounter for follow-up examination after completed treatment for conditions other than malignant neoplasm: Secondary | ICD-10-CM | POA: Diagnosis not present

## 2023-07-17 ENCOUNTER — Other Ambulatory Visit (HOSPITAL_COMMUNITY): Payer: Self-pay | Admitting: Internal Medicine

## 2023-07-17 DIAGNOSIS — R079 Chest pain, unspecified: Secondary | ICD-10-CM | POA: Diagnosis not present

## 2023-07-17 DIAGNOSIS — I2510 Atherosclerotic heart disease of native coronary artery without angina pectoris: Secondary | ICD-10-CM | POA: Diagnosis not present

## 2023-07-17 DIAGNOSIS — R1012 Left upper quadrant pain: Secondary | ICD-10-CM

## 2023-07-18 ENCOUNTER — Ambulatory Visit (HOSPITAL_COMMUNITY)
Admission: RE | Admit: 2023-07-18 | Discharge: 2023-07-18 | Disposition: A | Discharge status: Home / Self Care | Source: Ambulatory Visit | Attending: Internal Medicine | Admitting: Internal Medicine

## 2023-07-18 DIAGNOSIS — K5730 Diverticulosis of large intestine without perforation or abscess without bleeding: Secondary | ICD-10-CM | POA: Diagnosis not present

## 2023-07-18 DIAGNOSIS — R1012 Left upper quadrant pain: Secondary | ICD-10-CM | POA: Insufficient documentation

## 2023-07-18 MED ORDER — IOHEXOL 300 MG/ML  SOLN
100.0000 mL | Freq: Once | INTRAMUSCULAR | Status: AC | PRN
  Administered 2023-07-18: 100 mL via INTRAVENOUS

## 2023-08-01 DIAGNOSIS — G4730 Sleep apnea, unspecified: Secondary | ICD-10-CM | POA: Diagnosis not present

## 2023-08-01 DIAGNOSIS — I10 Essential (primary) hypertension: Secondary | ICD-10-CM | POA: Diagnosis not present

## 2023-08-01 DIAGNOSIS — R7303 Prediabetes: Secondary | ICD-10-CM | POA: Diagnosis not present

## 2023-08-01 DIAGNOSIS — E782 Mixed hyperlipidemia: Secondary | ICD-10-CM | POA: Diagnosis not present

## 2023-09-01 DIAGNOSIS — J3081 Allergic rhinitis due to animal (cat) (dog) hair and dander: Secondary | ICD-10-CM | POA: Diagnosis not present

## 2023-09-01 DIAGNOSIS — H1045 Other chronic allergic conjunctivitis: Secondary | ICD-10-CM | POA: Diagnosis not present

## 2023-09-01 DIAGNOSIS — J301 Allergic rhinitis due to pollen: Secondary | ICD-10-CM | POA: Diagnosis not present

## 2023-09-01 DIAGNOSIS — J3089 Other allergic rhinitis: Secondary | ICD-10-CM | POA: Diagnosis not present

## 2023-10-03 DIAGNOSIS — R1012 Left upper quadrant pain: Secondary | ICD-10-CM | POA: Diagnosis not present

## 2023-10-03 DIAGNOSIS — H6692 Otitis media, unspecified, left ear: Secondary | ICD-10-CM | POA: Diagnosis not present

## 2023-10-12 DIAGNOSIS — E669 Obesity, unspecified: Secondary | ICD-10-CM | POA: Insufficient documentation

## 2023-11-09 ENCOUNTER — Encounter: Payer: Self-pay | Admitting: Cardiology

## 2023-11-09 ENCOUNTER — Ambulatory Visit: Attending: Cardiology | Admitting: Cardiology

## 2023-11-09 VITALS — BP 123/74 | HR 72 | Resp 14 | Ht 73.0 in | Wt 281.8 lb

## 2023-11-09 DIAGNOSIS — E782 Mixed hyperlipidemia: Secondary | ICD-10-CM | POA: Diagnosis not present

## 2023-11-09 DIAGNOSIS — R0789 Other chest pain: Secondary | ICD-10-CM

## 2023-11-09 DIAGNOSIS — R739 Hyperglycemia, unspecified: Secondary | ICD-10-CM

## 2023-11-09 DIAGNOSIS — I2510 Atherosclerotic heart disease of native coronary artery without angina pectoris: Secondary | ICD-10-CM | POA: Diagnosis not present

## 2023-11-09 DIAGNOSIS — R002 Palpitations: Secondary | ICD-10-CM

## 2023-11-09 MED ORDER — ATORVASTATIN CALCIUM 10 MG PO TABS: 10.0000 mg | ORAL_TABLET | Freq: Every day | ORAL | 3 refills | Status: AC

## 2023-11-09 NOTE — Progress Notes (Signed)
 Cardiology Office Note:  .   Date:  11/09/2023  ID:  Russell Alvarez, DOB 04-26-1975, MRN [id number] PCP: Ransom Other, MD  [facility] Providers Cardiologist:  Gordy Bergamo, MD   History of Present Illness: .   Russell Alvarez is a 48 y.o. Caucasian male patient with hypertension, palpitations, mixed hypercholesterolemia, morbid obesity referred for evaluation of coronary artery calcification noted on the CT scan and exertional chest pain by Virginia  Cleotilde, PA-C.  Discussed the use of AI scribe software for clinical note transcription with the patient, who gave verbal consent to proceed.  History of Present Illness Russell Alvarez is a 48 year old male with a history of PVCs who presents with chest pain and coronary artery calcification. He was referred by his primary care physician for evaluation of chest pain and calcification noted on a CT scan of his heart arteries.  Chest pain has been present for six to seven months, primarily during physical activity such as jogging, running, or walking. The pain is sharp, located in the left lower chest, and subsides a few minutes after stopping the activity. It is not a daily occurrence. He notes 'little knots under the skin' in the area of discomfort.  He experiences heart palpitations, particularly at rest, with a slow heart rate during activity and racing heart rate at rest. Sensations of heart 'flip flopping' and episodes where it feels like the heart stops for a second or two have occurred two to three times in the last month.  He has lost about 20 pounds over the past two to three months, reducing his weight from 311 pounds to 282 pounds. He was previously on phentermine and metformin but has stopped due to prescription issues.  He has not been tested for sleep apnea but experiences symptoms such as waking up gasping for air when sleeping in a recliner and occasional snoring. He sleeps five to six hours per night and generally feels fine  during the day, though he notes a lack of energy compared to the past.  He is currently taking valsartan for blood pressure management, which is well-controlled. He also takes vitamin D 50,000 IU for low vitamin D levels. Cholesterol levels from January 2025 show an HDL of 35 and LDL of 89.  Labs   External Labs:  Labs faxed from PCP 04/24/2023:  Vitamin D 23.0.  Iron studies normal.  B12 266.  A1c 6.0%.  TSH normal at 1.30.  Total cholesterol 156, triglycerides 193, HDL 35, LDL 89.  Serum glucose 80 mg, BUN 15, creatinine 0.90, EGFR 104 mL, potassium 3.6.  Hb 14.8/HCT 43.1, platelets 204.  ROS  Review of Systems  Cardiovascular:  Positive for chest pain. Negative for dyspnea on exertion and leg swelling.   Physical Exam:   VS:  BP 123/74 (BP Location: Left Arm, Patient Position: Sitting, Cuff Size: Large)   Pulse 72   Resp 14   Ht 6' 1 (1.854 m)   Wt 281 lb 12.8 oz (127.8 kg)   SpO2 98%   BMI 37.18 kg/m    Wt Readings from Last 3 Encounters:  11/09/23 281 lb 12.8 oz (127.8 kg)  07/08/14 291 lb (132 kg)    Physical Exam Neck:     Vascular: No carotid bruit or JVD.  Cardiovascular:     Rate and Rhythm: Normal rate and regular rhythm.     Pulses: Intact distal pulses.     Heart sounds: Normal heart sounds. No murmur heard.  No gallop.  Pulmonary:     Effort: Pulmonary effort is normal.     Breath sounds: Normal breath sounds.  Chest:  Breasts:    Right: Tenderness (Left lower costochondral junctions) present.  Abdominal:     General: Bowel sounds are normal.     Palpations: Abdomen is soft.  Musculoskeletal:     Right lower leg: No edema.     Left lower leg: No edema.    Studies Reviewed: .    CT scan of the chest without contrast 12/17/2022: Cardiovascular: Normal heart size, normal caliber aorta, no significant atherosclerotic disease.  Mild coronary artery calcification noted. Lungs: Mild linear subpleural opacity in the right lower lobe likely due to  scarring or atelectasis.  Stable solid pulmonary nodule 4 mm, benign given multiyear stability.  CT scan of the abdomen and pelvis 07/18/2023: No abdominal attic aneurysm.  Mild atherosclerotic plaque in the aorta and its branches. Colonic diverticulosis. EKG:    EKG Interpretation Date/Time:  Thursday November 09 2023 10:58:10 EDT Ventricular Rate:  79 PR Interval:  140 QRS Duration:  94 QT Interval:  366 QTC Calculation: 419 R Axis:   -12  Text Interpretation: EKG 11/09/2023: Normal sinus rhythm at rate of 79 bpm, normal EKG. Confirmed by Mechel Schutter, Jagadeesh ([id number]) on 11/09/2023 11:08:41 AM    Medications ordered    Meds ordered this encounter  Medications   atorvastatin  (LIPITOR) 10 MG tablet    Sig: Take 1 tablet (10 mg total) by mouth daily.    Dispense:  90 tablet    Refill:  3     ASSESSMENT AND PLAN: .      ICD-10-CM   1. Musculoskeletal chest pain  R07.89 Cardiac Stress Test: Informed Consent Details: Physician/Practitioner Attestation; Transcribe to consent form and obtain patient signature    EXERCISE TOLERANCE TEST (ETT)    2. Coronary artery calcification seen on CAT scan  I25.10 EKG 12-Lead    Cardiac Stress Test: Informed Consent Details: Physician/Practitioner Attestation; Transcribe to consent form and obtain patient signature    EXERCISE TOLERANCE TEST (ETT)    3. Hyperglycemia  R73.9     4. Mixed hypercholesterolemia and hypertriglyceridemia  E78.2 atorvastatin  (LIPITOR) 10 MG tablet    5. Palpitations  R00.2       Assessment and Plan Assessment & Plan Musculoskeletal chest pain (costochondritis) Intermittent sharp chest pain localized around the sternum, exacerbated by physical activity and relieved by rest, suggests a musculoskeletal origin rather than cardiac. Differential diagnosis includes costochondritis versus angina pectoris. The focal nature and exacerbation by movement support costochondritis. - Advise deep breathing during pain episodes to assess  if pain worsens, indicating musculoskeletal origin. - Reassure that pain is likely musculoskeletal and not cardiac. - Consider acetaminophen for pain relief as needed. - Routine treadmill stress test will be performed to further evaluate.  Coronary artery calcification with mild aortic plaque Mild coronary artery calcification and aortic plaque noted on CT scan. At 47 years, the presence of calcification is concerning. Risk factors include obesity, prediabetes, and controlled hypertension. The goal is to prevent further progression of coronary artery disease. - Prescribe atorvastatin  10 mg daily to reduce LDL cholesterol to 55 mg/dL. - Advise follow-up with primary care physician to monitor cholesterol levels and adjust medication as needed. - Order treadmill stress test to evaluate cardiac function and rule out significant coronary artery disease.  Obesity Body mass index has decreased from over 40 to 38 with a 20-pound weight loss. Continued weight loss is  essential for reducing cardiovascular risk factors. - Encourage continued weight loss through diet and exercise. - Reassure that exercise is safe and beneficial, but monitor for musculoskeletal pain.  Prediabetes Prediabetes is present, contributing to cardiovascular risk. Weight loss and lifestyle changes are crucial for management. - Continue metformin as it aids in weight loss and management of prediabetes.  Essential hypertension, controlled Hypertension is well-controlled with valsartan. No recent episodes of elevated blood pressure except during pain episodes. - Continue valsartan for blood pressure management.  Palpitations due to PACs/PVCs Palpitations occur at rest and are not associated with activity, suggesting benign PACs/PVCs. Previous evaluation by cardiologist showed normal echocardiogram. Palpitations are life-altering but not life-threatening. - Order treadmill stress test to evaluate for any significant arrhythmias  during exercise. - Consider Holter monitor if palpitations become more frequent or symptomatic. - Reassure that palpitations are benign and not indicative of serious cardiac pathology.  I will personally perform the test and if I find abnormalities,  will perform further evaluation. Otherwise unless new on ongoing symptoms(patient advised to contact us ), preventive  therapy is recommended. I will then see the patient on a PRN basis.   Signed,  Gordy Bergamo, MD, [facility] 11/09/2023, 8:50 PM [facility] [address] [place], [place] [address] Phone: [phone]. Fax:  [phone]

## 2023-11-09 NOTE — Patient Instructions (Addendum)
 Medication Instructions:  Your physician has recommended you make the following change in your medication: Start atorvastatin  10 mg by mouth daily   *If you need a refill on your cardiac medications before your next appointment, please call your pharmacy*  Lab Work: none If you have labs (blood work) drawn today and your tests are completely normal, you will receive your results only by: MyChart Message (if you have MyChart) OR A paper copy in the mail If you have any lab test that is abnormal or we need to change your treatment, we will call you to review the results.  Testing/Procedures: Your physician has requested that you have an exercise tolerance test. For further information please visit [email or web address] Please also follow instruction sheet, as given.   Follow-Up: At [facility], you and your health needs are our priority.  As part of our continuing mission to provide you with exceptional heart care, our providers are all part of one team.  This team includes your primary Cardiologist (physician) and Advanced Practice Providers or APPs (Physician Assistants and Nurse Practitioners) who all work together to provide you with the care you need, when you need it.  Your next appointment:   As needed  Provider:   Gordy Bergamo, MD    We recommend signing up for the patient portal called MyChart.  Sign up information is provided on this After Visit Summary.  MyChart is used to connect with patients for Virtual Visits (Telemedicine).  Patients are able to view lab/test results, encounter notes, upcoming appointments, etc.  Non-urgent messages can be sent to your provider as well.   To learn more about what you can do with MyChart, go to [email or web address].   Other Instructions    Please arrive 15 minutes prior to your appointment time to allow for registration and insurance purposes.  The test will take approximately 45 minutes to complete.  How to prepare for your  Exercise Stress Test: - Do bring a list of your current medications with you.   You may take all your normal medications the day of the test - DO wear comfortable clothes (no dresses or overalls) and walking shoes, tennis shoes preferred (no heels or open toed shoes allowed).

## 2023-11-23 ENCOUNTER — Encounter (HOSPITAL_COMMUNITY): Payer: Self-pay | Admitting: *Deleted

## 2023-11-24 ENCOUNTER — Encounter (HOSPITAL_COMMUNITY): Payer: Self-pay | Admitting: *Deleted

## 2023-12-07 ENCOUNTER — Ambulatory Visit (HOSPITAL_COMMUNITY)
Admission: RE | Admit: 2023-12-07 | Discharge: 2023-12-07 | Disposition: A | Discharge status: Home / Self Care | Source: Ambulatory Visit | Attending: Cardiology | Admitting: Cardiology

## 2023-12-07 DIAGNOSIS — I2510 Atherosclerotic heart disease of native coronary artery without angina pectoris: Secondary | ICD-10-CM | POA: Insufficient documentation

## 2023-12-07 DIAGNOSIS — R0789 Other chest pain: Secondary | ICD-10-CM | POA: Insufficient documentation

## 2023-12-07 LAB — EXERCISE TOLERANCE TEST
Angina Index: 0
Base ST Depression (mm): 0 mm
Duke Treadmill Score: 9
Estimated workload: 10.4
Exercise duration (min): 9 min
Exercise duration (sec): 0 s
MPHR: 172 {beats}/min
Peak HR: 157 {beats}/min
Percent HR: 91 %
Rest HR: 77 {beats}/min
ST Depression (mm): 0 mm

## 2023-12-08 ENCOUNTER — Ambulatory Visit: Payer: Self-pay | Admitting: Cardiology

## 2023-12-08 NOTE — Progress Notes (Signed)
 Treadmill Exercise Stress 12/07/2023:   The patient exercised according to the BRUCE for 09: 00 min: s, achieving a work level of Max. METS: 10. 4. The resting heart rate of 63 bpm rose to a maximal heart rate of 157 bpm. This value represents 91 % of the maximal, age- predicted heart rate. The resting blood pressure of 122/ 91 mmHg , rose to a maximum blood pressure of 157/ 97 mmHg.   No ST deviation was noted.   Negative, adequate stress test.   Excellent exercise capacity.  Normal stress test. Continue cholesterol  medications, evaluate for high BP with PCP. See us  PRN.

## 2024-01-10 DIAGNOSIS — J309 Allergic rhinitis, unspecified: Secondary | ICD-10-CM | POA: Diagnosis not present

## 2024-01-10 DIAGNOSIS — J069 Acute upper respiratory infection, unspecified: Secondary | ICD-10-CM | POA: Diagnosis not present

## 2024-03-20 DIAGNOSIS — I10 Essential (primary) hypertension: Secondary | ICD-10-CM | POA: Diagnosis not present

## 2024-03-20 DIAGNOSIS — E782 Mixed hyperlipidemia: Secondary | ICD-10-CM | POA: Diagnosis not present

## 2024-03-20 DIAGNOSIS — Z0000 Encounter for general adult medical examination without abnormal findings: Secondary | ICD-10-CM | POA: Diagnosis not present

## 2024-03-20 DIAGNOSIS — R7303 Prediabetes: Secondary | ICD-10-CM | POA: Diagnosis not present

## 2024-03-23 IMAGING — CT CT ABD-PELV W/O CM
1 of 2 series · 13 of 32 positions shown, 19 images · non-contrast
Comparison: CT November 15, 2012

CLINICAL DATA: Two weeks of right flank pain.



[Series 2: abd/pelvis w/(date) · axial · 0.97mm/px · z∈[-608,-153]mm · 13 of 106 slices shown, 19 images]
[im 8/106  soft-tissue]
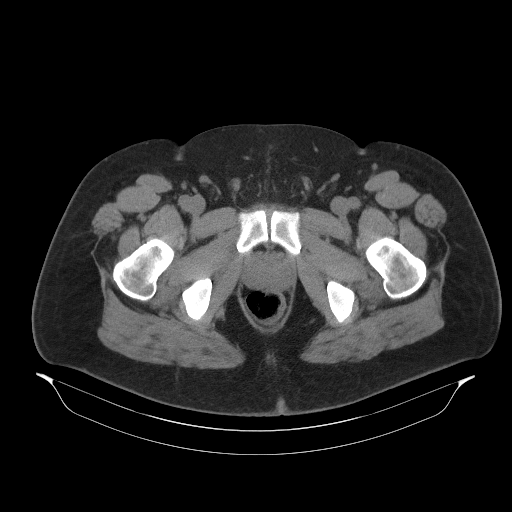
[im 8/106  bone]
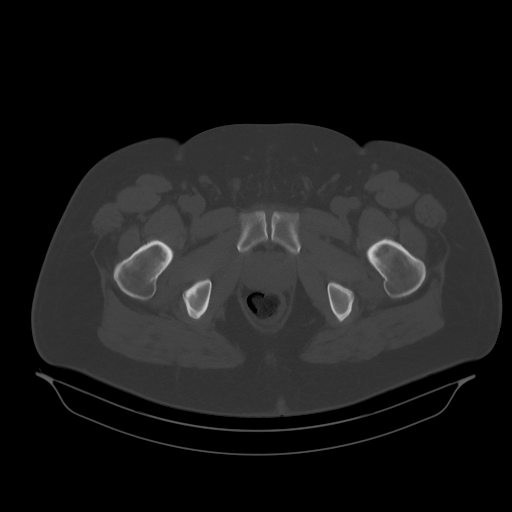
[im 15/106  soft-tissue]
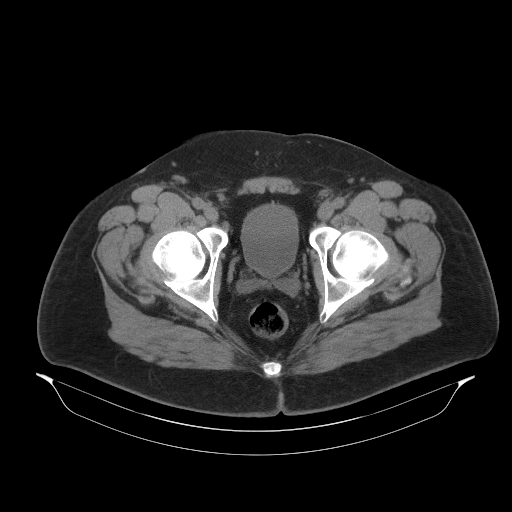
[im 22/106  soft-tissue]
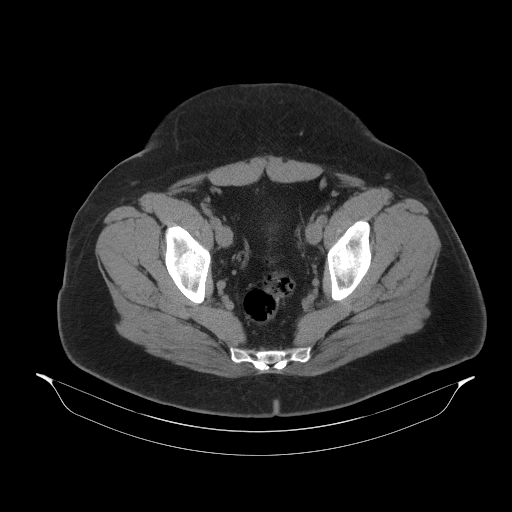
[im 29/106  soft-tissue]
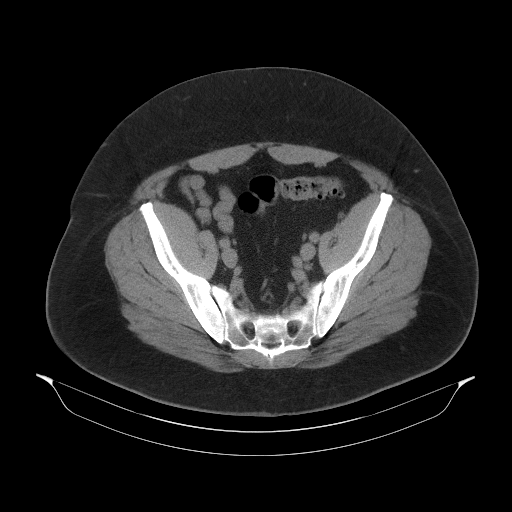
[im 36/106  soft-tissue]
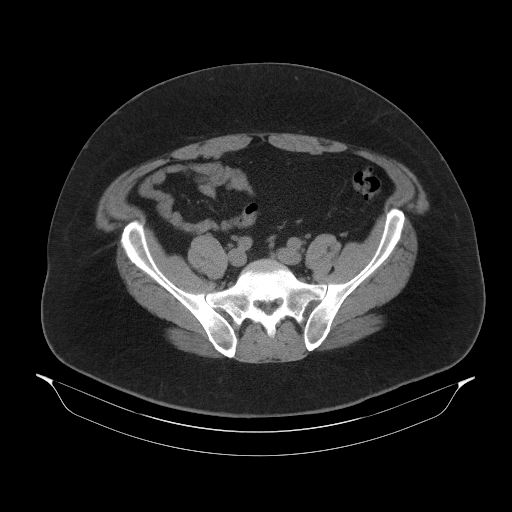
[im 43/106  soft-tissue]
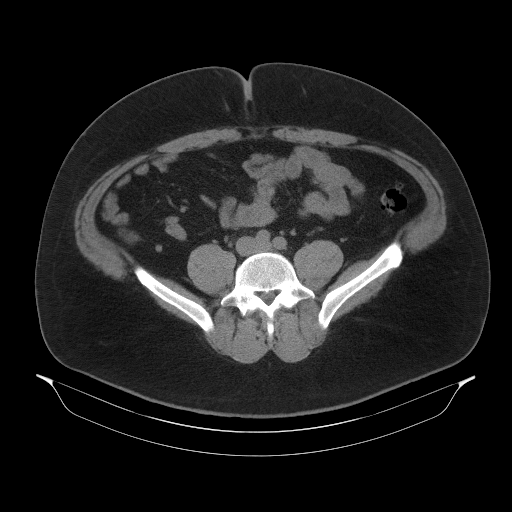
[im 57/106  soft-tissue]
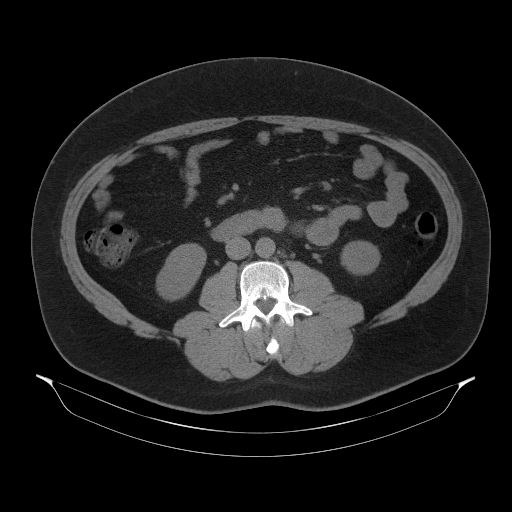
[im 64/106  soft-tissue]
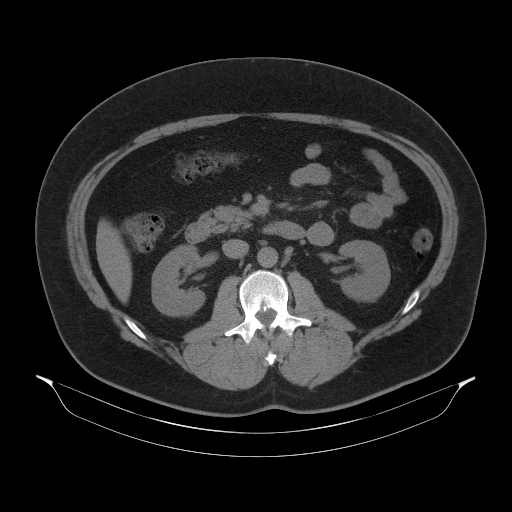
[im 71/106  soft-tissue]
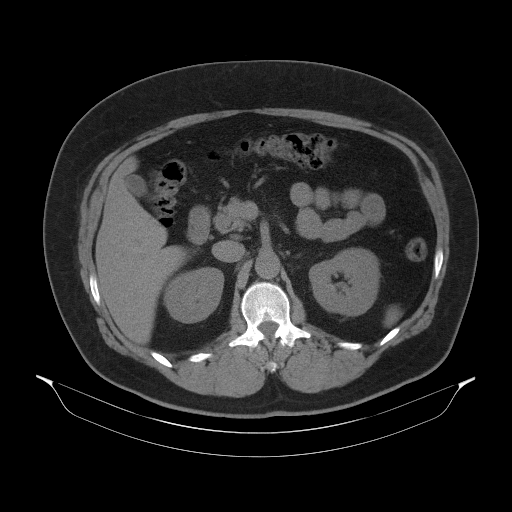
[im 71/106  bone]
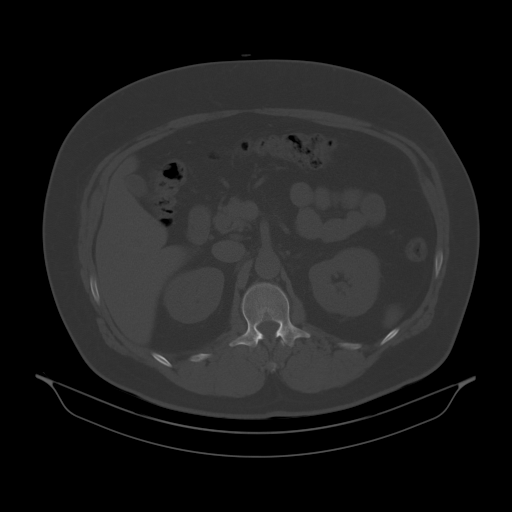
[im 78/106  soft-tissue]
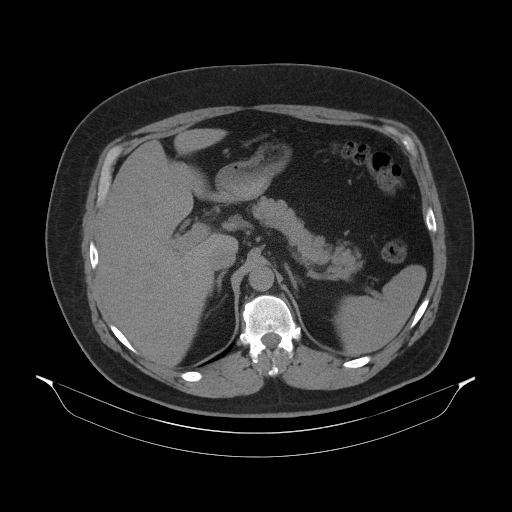
[im 78/106  lung]
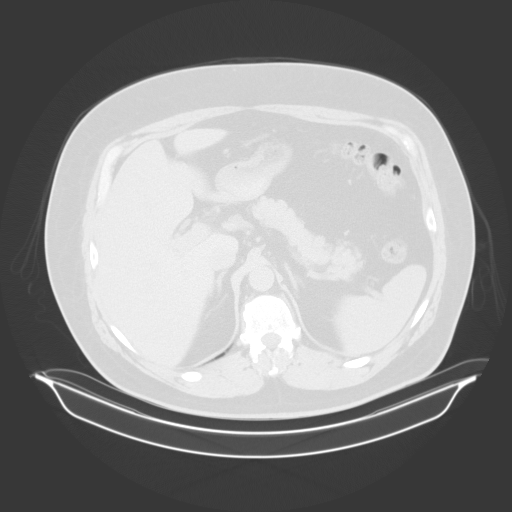
[im 85/106  soft-tissue]
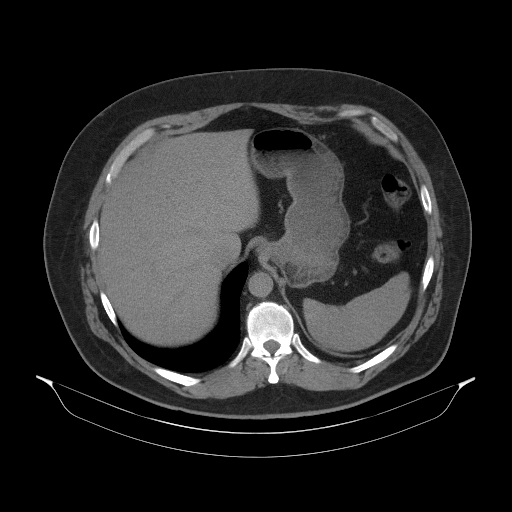
[im 85/106  lung]
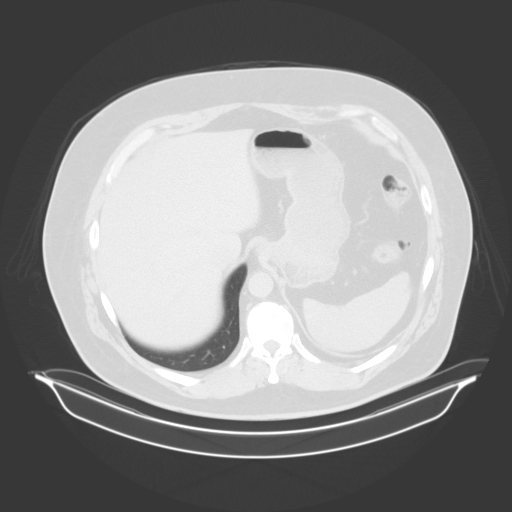
[im 92/106  soft-tissue]
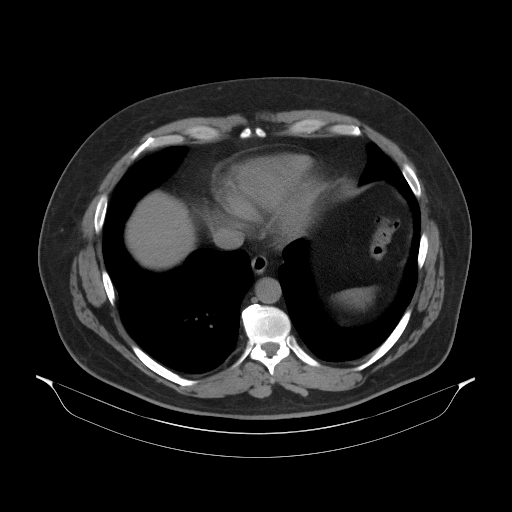
[im 92/106  lung]
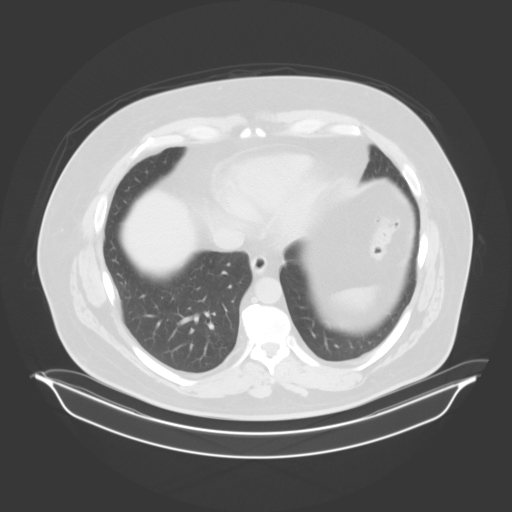
[im 99/106  soft-tissue]
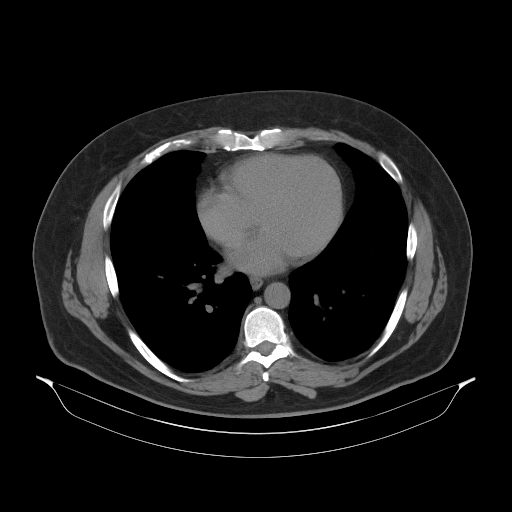
[im 99/106  lung]
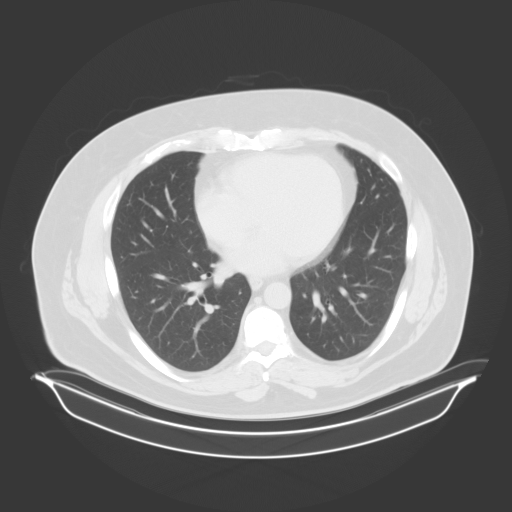

[13 of 32 positions shown; findings below may reference images not displayed]

FINDINGS: Lower chest: Solid nodule in the right lung base on image 42/5 is
linear in orientation on coronal CT consistent with atelectasis.

Hepatobiliary: Unremarkable noncontrast appearance of the hepatic
parenchyma. Gallbladder is unremarkable. No biliary ductal dilation.

Pancreas: No pancreatic ductal dilation or evidence of acute
inflammation.

Spleen: No splenomegaly or focal splenic lesion.

Adrenals/Urinary Tract: Bilateral adrenal glands appear normal. No
hydronephrosis. No renal, ureteral or bladder calculi identified.
Urinary bladder is unremarkable for degree of distension.

Stomach/Bowel: No radiopaque enteric contrast material was
administered. Stomach is unremarkable for degree of distension. No
pathologic dilation of small or large bowel. The appendix and
terminal ileum appear normal. Left-sided colonic diverticulosis
without findings of acute diverticulitis. No evidence of acute bowel
inflammation.

Vascular/Lymphatic: Normal caliber abdominal aorta. No
pathologically enlarged abdominal or pelvic lymph nodes.

Reproductive: Prostate is unremarkable.

Other: No significant abdominopelvic free fluid.

Musculoskeletal: No acute osseous abnormality.  Mild spondylosis.
IMPRESSION: 1. No acute abnormality of the abdomen or pelvis. Specifically, no
evidence of urinary tract calculi or evidence of obstructive
uropathy.
2. Left-sided colonic diverticulosis without findings of acute
diverticulitis.
